# Patient Record
Sex: Male | Born: 1970 | Race: Black or African American | Hispanic: No | Marital: Married | Smoking: Former smoker
Health system: Southern US, Community
[De-identification: ages and names within clinical notes are randomized; demographics above are authoritative.]

## PROBLEM LIST (undated history)

## (undated) DIAGNOSIS — N200 Calculus of kidney: Secondary | ICD-10-CM

## (undated) DIAGNOSIS — N289 Disorder of kidney and ureter, unspecified: Secondary | ICD-10-CM

## (undated) HISTORY — PX: ABDOMINAL SURGERY: SHX537

---

## 1997-07-16 ENCOUNTER — Emergency Department (HOSPITAL_COMMUNITY): Admission: EM | Admit: 1997-07-16 | Discharge: 1997-07-16 | Payer: Self-pay | Admitting: Emergency Medicine

## 2006-03-08 ENCOUNTER — Emergency Department (HOSPITAL_COMMUNITY): Admission: EM | Admit: 2006-03-08 | Discharge: 2006-03-08 | Payer: Self-pay | Admitting: Emergency Medicine

## 2008-05-27 ENCOUNTER — Emergency Department (HOSPITAL_COMMUNITY): Admission: EM | Admit: 2008-05-27 | Discharge: 2008-05-27 | Payer: Self-pay | Admitting: Emergency Medicine

## 2010-07-08 ENCOUNTER — Emergency Department (HOSPITAL_COMMUNITY)
Admission: EM | Admit: 2010-07-08 | Discharge: 2010-07-08 | Disposition: A | Discharge status: Home / Self Care | Payer: Self-pay | Attending: Emergency Medicine | Admitting: Emergency Medicine

## 2010-07-08 ENCOUNTER — Emergency Department (HOSPITAL_COMMUNITY): Payer: Self-pay

## 2010-07-08 DIAGNOSIS — H6590 Unspecified nonsuppurative otitis media, unspecified ear: Secondary | ICD-10-CM | POA: Insufficient documentation

## 2010-07-08 DIAGNOSIS — F1410 Cocaine abuse, uncomplicated: Secondary | ICD-10-CM | POA: Insufficient documentation

## 2010-07-08 DIAGNOSIS — R110 Nausea: Secondary | ICD-10-CM | POA: Insufficient documentation

## 2010-07-08 DIAGNOSIS — H81399 Other peripheral vertigo, unspecified ear: Secondary | ICD-10-CM | POA: Insufficient documentation

## 2010-07-08 DIAGNOSIS — R279 Unspecified lack of coordination: Secondary | ICD-10-CM | POA: Insufficient documentation

## 2010-07-08 LAB — POCT I-STAT, CHEM 8
Calcium, Ion: 1.21 mmol/L (ref 1.12–1.32)
Chloride: 102 mEq/L (ref 96–112)
HCT: 46 % (ref 39.0–52.0)
TCO2: 29 mmol/L (ref 0–100)

## 2010-07-08 LAB — CK TOTAL AND CKMB (NOT AT ARMC): Total CK: 151 U/L (ref 7–232)

## 2011-03-21 ENCOUNTER — Emergency Department (HOSPITAL_COMMUNITY): Payer: Self-pay

## 2011-03-21 ENCOUNTER — Emergency Department (HOSPITAL_COMMUNITY)
Admission: EM | Admit: 2011-03-21 | Discharge: 2011-03-21 | Disposition: A | Discharge status: Home / Self Care | Payer: Self-pay | Attending: Emergency Medicine | Admitting: Emergency Medicine

## 2011-03-21 ENCOUNTER — Encounter (HOSPITAL_COMMUNITY): Payer: Self-pay | Admitting: Emergency Medicine

## 2011-03-21 DIAGNOSIS — R109 Unspecified abdominal pain: Secondary | ICD-10-CM | POA: Insufficient documentation

## 2011-03-21 DIAGNOSIS — N5089 Other specified disorders of the male genital organs: Secondary | ICD-10-CM | POA: Insufficient documentation

## 2011-03-21 DIAGNOSIS — N453 Epididymo-orchitis: Secondary | ICD-10-CM | POA: Insufficient documentation

## 2011-03-21 DIAGNOSIS — F17200 Nicotine dependence, unspecified, uncomplicated: Secondary | ICD-10-CM | POA: Insufficient documentation

## 2011-03-21 DIAGNOSIS — N509 Disorder of male genital organs, unspecified: Secondary | ICD-10-CM | POA: Insufficient documentation

## 2011-03-21 DIAGNOSIS — R10819 Abdominal tenderness, unspecified site: Secondary | ICD-10-CM | POA: Insufficient documentation

## 2011-03-21 DIAGNOSIS — N451 Epididymitis: Secondary | ICD-10-CM

## 2011-03-21 HISTORY — DX: Calculus of kidney: N20.0

## 2011-03-21 HISTORY — DX: Disorder of kidney and ureter, unspecified: N28.9

## 2011-03-21 LAB — URINE MICROSCOPIC-ADD ON

## 2011-03-21 LAB — URINALYSIS, ROUTINE W REFLEX MICROSCOPIC
Bilirubin Urine: NEGATIVE
Glucose, UA: NEGATIVE mg/dL
Ketones, ur: NEGATIVE mg/dL
pH: 6 (ref 5.0–8.0)

## 2011-03-21 MED ORDER — CEFTRIAXONE SODIUM 250 MG IJ SOLR
250.0000 mg | Freq: Once | INTRAMUSCULAR | Status: AC
  Administered 2011-03-21: 250 mg via INTRAMUSCULAR
  Filled 2011-03-21: qty 250

## 2011-03-21 MED ORDER — DOXYCYCLINE HYCLATE 50 MG PO CAPS: 100.0000 mg | ORAL_CAPSULE | Freq: Two times a day (BID) | ORAL | Status: AC

## 2011-03-21 MED ORDER — LIDOCAINE HCL (PF) 1 % IJ SOLN
INTRAMUSCULAR | Status: AC
  Administered 2011-03-21: 2 mL via INTRAMUSCULAR
  Filled 2011-03-21: qty 5

## 2011-03-21 MED ORDER — OXYCODONE-ACETAMINOPHEN 5-325 MG PO TABS
1.0000 | ORAL_TABLET | Freq: Once | ORAL | Status: AC
  Administered 2011-03-21: 1 via ORAL
  Filled 2011-03-21: qty 1

## 2011-03-21 MED ORDER — PERCOCET 5-325 MG PO TABS: 1.0000 | ORAL_TABLET | Freq: Four times a day (QID) | ORAL | Status: AC | PRN

## 2011-03-21 MED ORDER — IBUPROFEN 800 MG PO TABS: 800.0000 mg | ORAL_TABLET | Freq: Three times a day (TID) | ORAL | Status: AC | PRN

## 2011-03-21 NOTE — ED Provider Notes (Signed)
Medical screening examination/treatment/procedure(s) were performed by non-physician practitioner and as supervising physician I was immediately available for consultation/collaboration.   Kirsi Hugh, MD 03/21/11 1600 

## 2011-03-21 NOTE — ED Provider Notes (Signed)
History     CSN: [id number]  Arrival date & time 03/21/11  4098   First MD Initiated Contact with Patient 03/21/11 1004      Chief Complaint  Patient presents with  . Testicle Pain    (Consider location/radiation/quality/duration/timing/severity/associated sxs/prior treatment) HPI Comments: The patient presents today with a 4 day history of left testicle pain. He reports the pain as gradual in onset that he first noticed when he was sitting down. He describes the pain as achy and sharp which radiates to his left lower abdomen and left flank. The pain has become progressively worse and rated 10/10 currently. He reports the left testicle as tender to the touch, and he is unsure of any lumps or masses. He reports a bout of diarrhea about 2 days ago which may or may not be related to the pain, per patient. He denies fever/chills, NV, dysuria, hematuria, and penile discharge.  Patient is a 41 y.o. male presenting with testicular pain. The history is provided by the patient.  Testicle Pain Pertinent negatives include no fever or vomiting.    Past Medical History  Diagnosis Date  . Renal disorder   . Kidney stone     Past Surgical History  Procedure Date  . Appendectomy     No family history on file.  History  Substance Use Topics  . Smoking status: Current Everyday Smoker  . Smokeless tobacco: Not on file  . Alcohol Use: Yes      Review of Systems  Constitutional: Negative for fever.  Gastrointestinal: Negative for vomiting.  Genitourinary: Positive for testicular pain. Negative for hematuria, discharge, penile swelling, scrotal swelling and penile pain.  All other systems reviewed and are negative.    Allergies  Review of patient's allergies indicates no known allergies.  Home Medications  No current outpatient prescriptions on file.  BP 135/89  Pulse 92  Temp(Src) 98.1 F (36.7 C) (Oral)  Resp 16  SpO2 95%  Physical Exam  Nursing note and vitals  reviewed. Constitutional: He is oriented to person, place, and time. He appears well-developed and well-nourished.  HENT:  Head: Normocephalic and atraumatic.  Neck: Neck supple.  Cardiovascular: Normal rate, regular rhythm and normal heart sounds.   Pulmonary/Chest: Breath sounds normal. No respiratory distress. He has no wheezes. He has no rales. He exhibits no tenderness.  Abdominal: Soft. Bowel sounds are normal. He exhibits no distension and no mass. There is tenderness. There is no rebound and no guarding.    Genitourinary: Right testis shows no mass, no swelling and no tenderness. Right testis is descended. Left testis shows swelling and tenderness. Left testis shows no mass. Left testis is descended. Circumcised. No discharge found.       Left inguinal lymphadenopathy  Neurological: He is alert and oriented to person, place, and time.  Psychiatric: He has a normal mood and affect. His behavior is normal. Judgment and thought content normal.    ED Course  Procedures (including critical care time)  Labs Reviewed  URINALYSIS, ROUTINE W REFLEX MICROSCOPIC - Abnormal; Notable for the following:    APPearance HAZY (*)    Hgb urine dipstick SMALL (*)    All other components within normal limits  URINE MICROSCOPIC-ADD ON   US Scrotum  03/21/2011  *RADIOLOGY REPORT*  Clinical Data:  Left testicular pain.  SCROTAL ULTRASOUND DOPPLER ULTRASOUND OF THE TESTICLES  Technique: Complete ultrasound examination of the testicles, epididymis, and other scrotal structures was performed.  Color and spectral Doppler ultrasound were  also utilized to evaluate blood flow to the testicles.  Comparison:  None.  Findings:  Right testis:  Measures 4.4 x 2.4 x 3.1 cm and demonstrates normal, homogeneous echotexture.  Left testis:  Measures 4.3 x 2.4 x 3.0 cm and demonstrates normal, homogeneous echotexture.  Right epididymis:  Normal in size and appearance.  Left epididymis:  Demonstrates increased color Doppler  signal compared to the right epididymis.  Hydocele:  Bilateral, larger on the right.  There appears to be some debris within the right hydrocele.  Varicocele:  Absent.  Pulsed Doppler interrogation of both testes demonstrates normal arterial and venous waveforms.  IMPRESSION: 1.  Findings compatible with epididymitis on the left. 2.  Right greater than left hydroceles.  Original Report Authenticated By: Bernadene Bell. Maricela Curet, M.D.   [place] Art/ven Flow Abd Pelv Doppler  03/21/2011  *RADIOLOGY REPORT*  Clinical Data:  Left testicular pain.  SCROTAL ULTRASOUND DOPPLER ULTRASOUND OF THE TESTICLES  Technique: Complete ultrasound examination of the testicles, epididymis, and other scrotal structures was performed.  Color and spectral Doppler ultrasound were also utilized to evaluate blood flow to the testicles.  Comparison:  None.  Findings:  Right testis:  Measures 4.4 x 2.4 x 3.1 cm and demonstrates normal, homogeneous echotexture.  Left testis:  Measures 4.3 x 2.4 x 3.0 cm and demonstrates normal, homogeneous echotexture.  Right epididymis:  Normal in size and appearance.  Left epididymis:  Demonstrates increased color Doppler signal compared to the right epididymis.  Hydocele:  Bilateral, larger on the right.  There appears to be some debris within the right hydrocele.  Varicocele:  Absent.  Pulsed Doppler interrogation of both testes demonstrates normal arterial and venous waveforms.  IMPRESSION: 1.  Findings compatible with epididymitis on the left. 2.  Right greater than left hydroceles.  Original Report Authenticated By: Bernadene Bell. D'ALESSIO, M.D.     1. Epididymitis, left       MDM  Nontoxic afebrile patient with tenderness of left testicle found to have epididymitis by ultrasound, which is consistent with his clinical presentation.  Patient discharged home with pain medication, antibiotics, and urology followup.  Patient verbalizes understanding and agrees with plan.         Rise Patience,  PA 03/21/11 1433  Rise Patience, [place] 03/21/11 1434

## 2011-03-21 NOTE — ED Notes (Signed)
Pt received to [facility] with c/o LLQ pain, lt testicular pain onset 2-3 days ago that is not getting better. Pt denies any blood in the urine nor fever. Pt is NAD
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
## 2011-03-21 NOTE — ED Notes (Signed)
Left testicle pain x 4 days hurts to touch

## 2011-07-09 ENCOUNTER — Emergency Department (HOSPITAL_COMMUNITY)
Admission: EM | Admit: 2011-07-09 | Discharge: 2011-07-09 | Disposition: A | Discharge status: Home / Self Care | Payer: Self-pay | Attending: Emergency Medicine | Admitting: Emergency Medicine

## 2011-07-09 ENCOUNTER — Encounter (HOSPITAL_COMMUNITY): Payer: Self-pay | Admitting: Emergency Medicine

## 2011-07-09 DIAGNOSIS — T25022A Burn of unspecified degree of left foot, initial encounter: Secondary | ICD-10-CM

## 2011-07-09 DIAGNOSIS — X19XXXA Contact with other heat and hot substances, initial encounter: Secondary | ICD-10-CM | POA: Insufficient documentation

## 2011-07-09 DIAGNOSIS — Z23 Encounter for immunization: Secondary | ICD-10-CM | POA: Insufficient documentation

## 2011-07-09 DIAGNOSIS — T25239A Burn of second degree of unspecified toe(s) (nail), initial encounter: Secondary | ICD-10-CM | POA: Insufficient documentation

## 2011-07-09 HISTORY — DX: Calculus of kidney: N20.0

## 2011-07-09 MED ORDER — HYDROCODONE-ACETAMINOPHEN 5-500 MG PO TABS: 1.0000 | ORAL_TABLET | Freq: Four times a day (QID) | ORAL | Status: AC | PRN

## 2011-07-09 MED ORDER — IBUPROFEN 200 MG PO TABS
600.0000 mg | ORAL_TABLET | Freq: Once | ORAL | Status: AC
  Administered 2011-07-09: 600 mg via ORAL
  Filled 2011-07-09: qty 3

## 2011-07-09 MED ORDER — IBUPROFEN 600 MG PO TABS: 600.0000 mg | ORAL_TABLET | Freq: Four times a day (QID) | ORAL | Status: AC | PRN

## 2011-07-09 MED ORDER — BACITRACIN 500 UNIT/GM EX OINT
1.0000 "application " | TOPICAL_OINTMENT | Freq: Two times a day (BID) | CUTANEOUS | Status: DC
  Administered 2011-07-09: 1 via TOPICAL
  Filled 2011-07-09 (×3): qty 0.9

## 2011-07-09 MED ORDER — TETANUS-DIPHTH-ACELL PERTUSSIS 5-2.5-18.5 LF-MCG/0.5 IM SUSP
0.5000 mL | Freq: Once | INTRAMUSCULAR | Status: AC
  Administered 2011-07-09: 0.5 mL via INTRAMUSCULAR
  Filled 2011-07-09: qty 0.5

## 2011-07-09 MED ORDER — HYDROCODONE-ACETAMINOPHEN 5-325 MG PO TABS
1.0000 | ORAL_TABLET | Freq: Once | ORAL | Status: DC
  Filled 2011-07-09: qty 1
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
## 2011-07-09 NOTE — Discharge Instructions (Signed)
Keep burn area very clean/dry.  Wash area thoroughly 2x/day with warm soap and water.  You may apply a thin coat of bacitracin or neosporin to areas for next few days after thoroughly cleaning wound.  Take motrin as need for pain.  Follow up with primary care doctor/urgent care in 1 week for recheck. Return to ER if worse, severe pain, spreading redness, fevers, other concern.      Burn Care Your skin is a natural barrier to infection. It is the largest organ of your body. Burns damage this natural protection. To help prevent infection, it is very important to follow your caregiver's instructions in the care of your burn. Burns are classified as:  First degree. There is only redness of the skin (erythema). No scarring is expected.   Second degree. There is blistering of the skin. Scarring may occur with deeper burns.   Third degree. All layers of the skin are injured, and scarring is expected.  HOME CARE INSTRUCTIONS   Wash your hands well before changing your bandage.   Change your bandage as often as directed by your caregiver.   Remove the old bandage. If the bandage sticks, you may soak it off with cool, clean water.   Cleanse the burn thoroughly but gently with mild soap and water.   Pat the area dry with a clean, dry cloth.   Apply a thin layer of antibacterial cream to the burn.   Apply a clean bandage as instructed by your caregiver.   Keep the bandage as clean and dry as possible.   Elevate the affected area for the first 24 hours, then as instructed by your caregiver.   Only take over-the-counter or prescription medicines for pain, discomfort, or fever as directed by your caregiver.  SEEK IMMEDIATE MEDICAL CARE IF:   You develop excessive pain.   You develop redness, tenderness, swelling, or red streaks near the burn.   The burned area develops yellowish-white fluid (pus) or a bad smell.   You have a fever.  MAKE SURE YOU:   Understand these instructions.     Will watch your condition.   Will get help right away if you are not doing well or get worse.  Document Released: 01/22/2005 Document Revised: 01/11/2011 Document Reviewed: 06/14/2010 [facility] Patient Information 2012 [place], [place].

## 2011-07-09 NOTE — ED Notes (Signed)
Site clean. Bacitracin applied and wrapped. Pt given boot for comfort.
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
## 2011-09-27 ENCOUNTER — Emergency Department (HOSPITAL_COMMUNITY): Payer: Self-pay

## 2011-09-27 ENCOUNTER — Emergency Department (HOSPITAL_COMMUNITY)
Admission: EM | Admit: 2011-09-27 | Discharge: 2011-09-27 | Disposition: A | Discharge status: Home / Self Care | Payer: Self-pay | Attending: Emergency Medicine | Admitting: Emergency Medicine

## 2011-09-27 ENCOUNTER — Encounter (HOSPITAL_COMMUNITY): Payer: Self-pay

## 2011-09-27 DIAGNOSIS — F17200 Nicotine dependence, unspecified, uncomplicated: Secondary | ICD-10-CM | POA: Insufficient documentation

## 2011-09-27 DIAGNOSIS — R109 Unspecified abdominal pain: Secondary | ICD-10-CM | POA: Insufficient documentation

## 2011-09-27 DIAGNOSIS — N200 Calculus of kidney: Secondary | ICD-10-CM

## 2011-09-27 DIAGNOSIS — N201 Calculus of ureter: Secondary | ICD-10-CM | POA: Insufficient documentation

## 2011-09-27 LAB — CBC WITH DIFFERENTIAL/PLATELET
Eosinophils Absolute: 0.1 10*3/uL (ref 0.0–0.7)
Lymphs Abs: 2.4 10*3/uL (ref 0.7–4.0)
MCH: 30 pg (ref 26.0–34.0)
Neutrophils Relative %: 62 % (ref 43–77)
Platelets: 231 10*3/uL (ref 150–400)
RBC: 4.76 MIL/uL (ref 4.22–5.81)
WBC: 8.3 10*3/uL (ref 4.0–10.5)

## 2011-09-27 LAB — COMPREHENSIVE METABOLIC PANEL
ALT: 20 U/L (ref 0–53)
AST: 17 U/L (ref 0–37)
Albumin: 4.2 g/dL (ref 3.5–5.2)
Alkaline Phosphatase: 63 U/L (ref 39–117)
GFR calc Af Amer: 44 mL/min — ABNORMAL LOW (ref >90)
Glucose, Bld: 95 mg/dL (ref 70–99)
Potassium: 4.3 mEq/L (ref 3.5–5.1)
Sodium: 140 mEq/L (ref 135–145)
Total Protein: 8 g/dL (ref 6.0–8.3)

## 2011-09-27 LAB — URINALYSIS, ROUTINE W REFLEX MICROSCOPIC
Nitrite: NEGATIVE
Specific Gravity, Urine: 1.014 (ref 1.005–1.030)
Urobilinogen, UA: 0.2 mg/dL (ref 0.0–1.0)

## 2011-09-27 LAB — URINE MICROSCOPIC-ADD ON

## 2011-09-27 MED ORDER — OXYCODONE-ACETAMINOPHEN 5-325 MG PO TABS: 1.0000 | ORAL_TABLET | Freq: Four times a day (QID) | ORAL | Status: AC | PRN

## 2011-09-27 MED ORDER — TAMSULOSIN HCL 0.4 MG PO CAPS: 0.4000 mg | ORAL_CAPSULE | Freq: Every day | ORAL | Status: DC

## 2011-09-27 MED ORDER — KETOROLAC TROMETHAMINE 60 MG/2ML IM SOLN
60.0000 mg | Freq: Once | INTRAMUSCULAR | Status: AC
  Administered 2011-09-27: 60 mg via INTRAMUSCULAR
  Filled 2011-09-27: qty 2
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
## 2011-09-27 NOTE — ED Notes (Signed)
Pt reports (R) flank pain radiating around to RLQ region and N/V x4 days. Pt denies problems w/urinating, pt reports hx of kidney stones

## 2015-04-11 ENCOUNTER — Emergency Department (HOSPITAL_COMMUNITY)
Admission: EM | Admit: 2015-04-11 | Discharge: 2015-04-11 | Disposition: A | Discharge status: Home / Self Care | Payer: Self-pay | Attending: Emergency Medicine | Admitting: Emergency Medicine

## 2015-04-11 ENCOUNTER — Encounter (HOSPITAL_COMMUNITY): Payer: Self-pay | Admitting: Emergency Medicine

## 2015-04-11 ENCOUNTER — Emergency Department (HOSPITAL_COMMUNITY): Payer: Self-pay

## 2015-04-11 DIAGNOSIS — W3400XA Accidental discharge from unspecified firearms or gun, initial encounter: Secondary | ICD-10-CM | POA: Insufficient documentation

## 2015-04-11 DIAGNOSIS — Y9389 Activity, other specified: Secondary | ICD-10-CM | POA: Insufficient documentation

## 2015-04-11 DIAGNOSIS — S71142A Puncture wound with foreign body, left thigh, initial encounter: Secondary | ICD-10-CM | POA: Insufficient documentation

## 2015-04-11 DIAGNOSIS — Y998 Other external cause status: Secondary | ICD-10-CM | POA: Insufficient documentation

## 2015-04-11 DIAGNOSIS — Y9289 Other specified places as the place of occurrence of the external cause: Secondary | ICD-10-CM | POA: Insufficient documentation

## 2015-04-11 DIAGNOSIS — S71132A Puncture wound without foreign body, left thigh, initial encounter: Secondary | ICD-10-CM

## 2015-04-11 DIAGNOSIS — T1490XA Injury, unspecified, initial encounter: Secondary | ICD-10-CM

## 2015-04-11 LAB — COMPREHENSIVE METABOLIC PANEL
ALBUMIN: 3.9 g/dL (ref 3.5–5.0)
ALT: 16 U/L — ABNORMAL LOW (ref 17–63)
ANION GAP: 20 — AB (ref 5–15)
AST: 29 U/L (ref 15–41)
Alkaline Phosphatase: 56 U/L (ref 38–126)
BUN: 15 mg/dL (ref 6–20)
CO2: 15 mmol/L — AB (ref 22–32)
Calcium: 9.2 mg/dL (ref 8.9–10.3)
Chloride: 106 mmol/L (ref 101–111)
Creatinine, Ser: 1.61 mg/dL — ABNORMAL HIGH (ref 0.61–1.24)
GFR calc non Af Amer: 51 mL/min — ABNORMAL LOW (ref >60)
GFR, EST AFRICAN AMERICAN: 59 mL/min — AB (ref >60)
GLUCOSE: 236 mg/dL — AB (ref 65–99)
POTASSIUM: 4 mmol/L (ref 3.5–5.1)
SODIUM: 141 mmol/L (ref 135–145)
TOTAL PROTEIN: 7.1 g/dL (ref 6.5–8.1)
Total Bilirubin: 0.6 mg/dL (ref 0.3–1.2)

## 2015-04-11 LAB — PREPARE FRESH FROZEN PLASMA
UNIT DIVISION: 0
Unit division: 0

## 2015-04-11 LAB — CBC
HEMATOCRIT: 39.5 % (ref 39.0–52.0)
HEMOGLOBIN: 13.1 g/dL (ref 13.0–17.0)
MCH: 29.7 pg (ref 26.0–34.0)
MCHC: 33.2 g/dL (ref 30.0–36.0)
MCV: 89.6 fL (ref 78.0–100.0)
Platelets: 243 10*3/uL (ref 150–400)
RBC: 4.41 MIL/uL (ref 4.22–5.81)
RDW: 14.2 % (ref 11.5–15.5)
WBC: 8.4 10*3/uL (ref 4.0–10.5)

## 2015-04-11 LAB — PROTIME-INR
INR: 1.15 (ref 0.00–1.49)
PROTHROMBIN TIME: 14.9 s (ref 11.6–15.2)

## 2015-04-11 LAB — ETHANOL: Alcohol, Ethyl (B): <5 mg/dL (ref <5)

## 2015-04-11 LAB — CDS SEROLOGY

## 2015-04-11 LAB — ABO/RH: ABO/RH(D): A NEG

## 2015-04-11 MED ORDER — FENTANYL CITRATE (PF) 100 MCG/2ML IJ SOLN
INTRAMUSCULAR | Status: AC
  Filled 2015-04-11: qty 2

## 2015-04-11 MED ORDER — HYDROMORPHONE HCL 1 MG/ML IJ SOLN
INTRAMUSCULAR | Status: DC
Start: 2015-04-11 — End: 2015-04-11
  Filled 2015-04-11: qty 1

## 2015-04-11 MED ORDER — FENTANYL CITRATE (PF) 100 MCG/2ML IJ SOLN: 75.0000 ug | Freq: Once | INTRAMUSCULAR | Status: DC

## 2015-04-11 MED ORDER — HYDROCODONE-ACETAMINOPHEN 5-325 MG PO TABS: 1.0000 | ORAL_TABLET | Freq: Three times a day (TID) | ORAL | Status: AC | PRN

## 2015-04-11 MED ORDER — ACETAMINOPHEN 500 MG PO TABS: 1000.0000 mg | ORAL_TABLET | Freq: Three times a day (TID) | ORAL | Status: AC

## 2015-04-11 MED ORDER — HYDROMORPHONE HCL 1 MG/ML IJ SOLN
1.0000 mg | Freq: Once | INTRAMUSCULAR | Status: AC
  Administered 2015-04-11: 1 mg via INTRAVENOUS

## 2015-04-11 MED ORDER — IOHEXOL 350 MG/ML SOLN
100.0000 mL | Freq: Once | INTRAVENOUS | Status: AC | PRN
  Administered 2015-04-11: 100 mL via INTRAVENOUS

## 2015-04-11 MED ORDER — TETANUS-DIPHTH-ACELL PERTUSSIS 5-2.5-18.5 LF-MCG/0.5 IM SUSP: 0.5000 mL | Freq: Once | INTRAMUSCULAR | Status: DC
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
## 2015-04-11 NOTE — Progress Notes (Signed)
Orthopedic Tech Progress Note Patient Details:  Trevor Poserdward L Benjamin 12/26/1970 [id number]  Patient ID: Trevor Benjamin, male   DOB: 12/26/1970, 45 y.o.   MRN: [id number]   Trevor Benjamin 04/11/2015, 3:00 PMLevel one  Trauma.

## 2015-04-11 NOTE — Progress Notes (Signed)
Orthopedic Tech Progress Note Patient Details:  Trevor Benjamin [birth year] [id number]  Ortho Devices Type of Ortho Device: Crutches Ortho Device/Splint Interventions: Application   Saul FordyceJennifer C Marcel Benjamin 04/11/2015, 5:28 PM
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
## 2015-04-12 ENCOUNTER — Encounter (HOSPITAL_COMMUNITY): Payer: Self-pay

## 2015-04-12 LAB — TYPE AND SCREEN
ABO/RH(D): A NEG
ANTIBODY SCREEN: NEGATIVE
UNIT DIVISION: 0
Unit division: 0

## 2015-04-19 ENCOUNTER — Telehealth (HOSPITAL_COMMUNITY): Payer: Self-pay

## 2015-04-20 NOTE — Telephone Encounter (Signed)
Message left  No answer
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
## 2015-04-22 ENCOUNTER — Telehealth (HOSPITAL_COMMUNITY): Payer: Self-pay

## 2015-04-22 NOTE — Telephone Encounter (Signed)
Talked about ibuprofen/tylenol, ice, and elevation.  Discussed cleaning wound with soap/water in the shower and applying antibiotic ointment and band-aids to the site.    Made him an appt for a wound check on 04/27/15 at 3:00pm

## 2015-04-27 ENCOUNTER — Telehealth (HOSPITAL_COMMUNITY): Payer: Self-pay

## 2016-01-17 ENCOUNTER — Emergency Department (HOSPITAL_COMMUNITY)
Admission: EM | Admit: 2016-01-17 | Discharge: 2016-01-17 | Disposition: A | Discharge status: Home / Self Care | Payer: Self-pay | Attending: Emergency Medicine | Admitting: Emergency Medicine

## 2016-01-17 ENCOUNTER — Encounter (HOSPITAL_COMMUNITY): Payer: Self-pay | Admitting: Emergency Medicine

## 2016-01-17 DIAGNOSIS — T148XXA Other injury of unspecified body region, initial encounter: Secondary | ICD-10-CM

## 2016-01-17 DIAGNOSIS — X500XXA Overexertion from strenuous movement or load, initial encounter: Secondary | ICD-10-CM | POA: Insufficient documentation

## 2016-01-17 DIAGNOSIS — Y990 Civilian activity done for income or pay: Secondary | ICD-10-CM | POA: Insufficient documentation

## 2016-01-17 DIAGNOSIS — Y929 Unspecified place or not applicable: Secondary | ICD-10-CM | POA: Insufficient documentation

## 2016-01-17 DIAGNOSIS — Y939 Activity, unspecified: Secondary | ICD-10-CM | POA: Insufficient documentation

## 2016-01-17 DIAGNOSIS — M549 Dorsalgia, unspecified: Secondary | ICD-10-CM

## 2016-01-17 DIAGNOSIS — F17290 Nicotine dependence, other tobacco product, uncomplicated: Secondary | ICD-10-CM | POA: Insufficient documentation

## 2016-01-17 DIAGNOSIS — S29012A Strain of muscle and tendon of back wall of thorax, initial encounter: Secondary | ICD-10-CM | POA: Insufficient documentation

## 2016-01-17 MED ORDER — METHOCARBAMOL 500 MG PO TABS: 500.0000 mg | ORAL_TABLET | Freq: Four times a day (QID) | ORAL | 0 refills | Status: DC | PRN

## 2016-01-17 MED ORDER — IBUPROFEN 800 MG PO TABS: 800.0000 mg | ORAL_TABLET | Freq: Three times a day (TID) | ORAL | 0 refills | Status: DC | PRN

## 2016-01-17 MED ORDER — LIDOCAINE 5 % EX PTCH: 1.0000 | MEDICATED_PATCH | CUTANEOUS | 0 refills | Status: DC

## 2016-01-17 MED ORDER — METHOCARBAMOL 500 MG PO TABS
1000.0000 mg | ORAL_TABLET | Freq: Once | ORAL | Status: AC
  Administered 2016-01-17: 1000 mg via ORAL
  Filled 2016-01-17: qty 2

## 2016-01-17 NOTE — ED Notes (Signed)
Patient was alert, oriented and stable upon discharge. RN went over AVS and patient had no further questions.  
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
## 2016-01-17 NOTE — ED Triage Notes (Signed)
Patient c/o pinched nerve in upper back area for several days.

## 2016-01-17 NOTE — Discharge Instructions (Signed)
Read the information below.  Use the prescribed medication as directed.  Please discuss all new medications with your pharmacist.  You may return to the Emergency Department at any time for worsening condition or any new symptoms that concern you.    If you develop fevers, loss of control of bowel or bladder, weakness or numbness in your arms or legs, or are unable to walk, return to the ER for a recheck.  °

## 2016-06-15 ENCOUNTER — Emergency Department (HOSPITAL_BASED_OUTPATIENT_CLINIC_OR_DEPARTMENT_OTHER)
Admission: EM | Admit: 2016-06-15 | Discharge: 2016-06-15 | Disposition: A | Discharge status: Home / Self Care | Payer: Self-pay | Attending: Emergency Medicine | Admitting: Emergency Medicine

## 2016-06-15 ENCOUNTER — Encounter (HOSPITAL_BASED_OUTPATIENT_CLINIC_OR_DEPARTMENT_OTHER): Payer: Self-pay | Admitting: *Deleted

## 2016-06-15 ENCOUNTER — Emergency Department (HOSPITAL_BASED_OUTPATIENT_CLINIC_OR_DEPARTMENT_OTHER): Payer: Self-pay

## 2016-06-15 DIAGNOSIS — M5412 Radiculopathy, cervical region: Secondary | ICD-10-CM | POA: Insufficient documentation

## 2016-06-15 DIAGNOSIS — R0789 Other chest pain: Secondary | ICD-10-CM | POA: Insufficient documentation

## 2016-06-15 DIAGNOSIS — Z87891 Personal history of nicotine dependence: Secondary | ICD-10-CM | POA: Insufficient documentation

## 2016-06-15 DIAGNOSIS — N289 Disorder of kidney and ureter, unspecified: Secondary | ICD-10-CM | POA: Insufficient documentation

## 2016-06-15 LAB — CBC
HCT: 39.1 % (ref 39.0–52.0)
Hemoglobin: 13.5 g/dL (ref 13.0–17.0)
MCH: 30.6 pg (ref 26.0–34.0)
MCHC: 34.5 g/dL (ref 30.0–36.0)
MCV: 88.7 fL (ref 78.0–100.0)
PLATELETS: 259 10*3/uL (ref 150–400)
RBC: 4.41 MIL/uL (ref 4.22–5.81)
RDW: 14 % (ref 11.5–15.5)
WBC: 6 10*3/uL (ref 4.0–10.5)

## 2016-06-15 LAB — CBG MONITORING, ED: GLUCOSE-CAPILLARY: 116 mg/dL — AB (ref 65–99)

## 2016-06-15 LAB — BASIC METABOLIC PANEL
Anion gap: 7 (ref 5–15)
BUN: 16 mg/dL (ref 6–20)
CALCIUM: 9 mg/dL (ref 8.9–10.3)
CO2: 27 mmol/L (ref 22–32)
CREATININE: 1.31 mg/dL — AB (ref 0.61–1.24)
Chloride: 107 mmol/L (ref 101–111)
GFR calc Af Amer: >60 mL/min (ref >60)
GFR calc non Af Amer: >60 mL/min (ref >60)
GLUCOSE: 125 mg/dL — AB (ref 65–99)
Potassium: 4.2 mmol/L (ref 3.5–5.1)
Sodium: 141 mmol/L (ref 135–145)

## 2016-06-15 LAB — TROPONIN I: Troponin I: <0.03 ng/mL (ref <0.03)

## 2016-06-15 MED ORDER — ASPIRIN 81 MG PO CHEW
324.0000 mg | CHEWABLE_TABLET | Freq: Once | ORAL | Status: AC
  Administered 2016-06-15: 324 mg via ORAL
  Filled 2016-06-15: qty 4

## 2016-06-15 MED ORDER — DEXAMETHASONE SODIUM PHOSPHATE 10 MG/ML IJ SOLN
10.0000 mg | Freq: Once | INTRAMUSCULAR | Status: AC
  Administered 2016-06-15: 10 mg via INTRAVENOUS
  Filled 2016-06-15: qty 1

## 2016-06-15 MED ORDER — PREDNISONE 10 MG (21) PO TBPK: ORAL_TABLET | ORAL | 0 refills | Status: AC

## 2016-06-15 MED ORDER — HYDROCODONE-ACETAMINOPHEN 5-325 MG PO TABS: 1.0000 | ORAL_TABLET | ORAL | 0 refills | Status: AC | PRN

## 2016-06-15 MED FILL — predniSONE 10 MG TABS: 10 | 12 days supply | Qty: 42 | Fill #0

## 2016-06-15 MED FILL — HYDROCODON-APAP 5-325: 5-325 | 2 days supply | Qty: 10 | Fill #0
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
## 2016-06-15 NOTE — ED Triage Notes (Signed)
Pt report left sided chest pain radiating to left arm, hand and neck x 5 days. Pain is constant, increases with exertion and deep inspiration. Denies sob, dizzyness, nausea or diaphoresis. ekg performed while pt being triaged.
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
## 2018-04-28 IMAGING — CR DG CHEST 2V
2 series · 2 of 2 positions shown · non-contrast
Comparison: CT Abdomen and Pelvis 09/27/2011

CLINICAL DATA: 45-year-old male with chest pain radiating to the
left arm for 4 days.

EXAM:
CHEST  2 VIEW

[w chest pa]
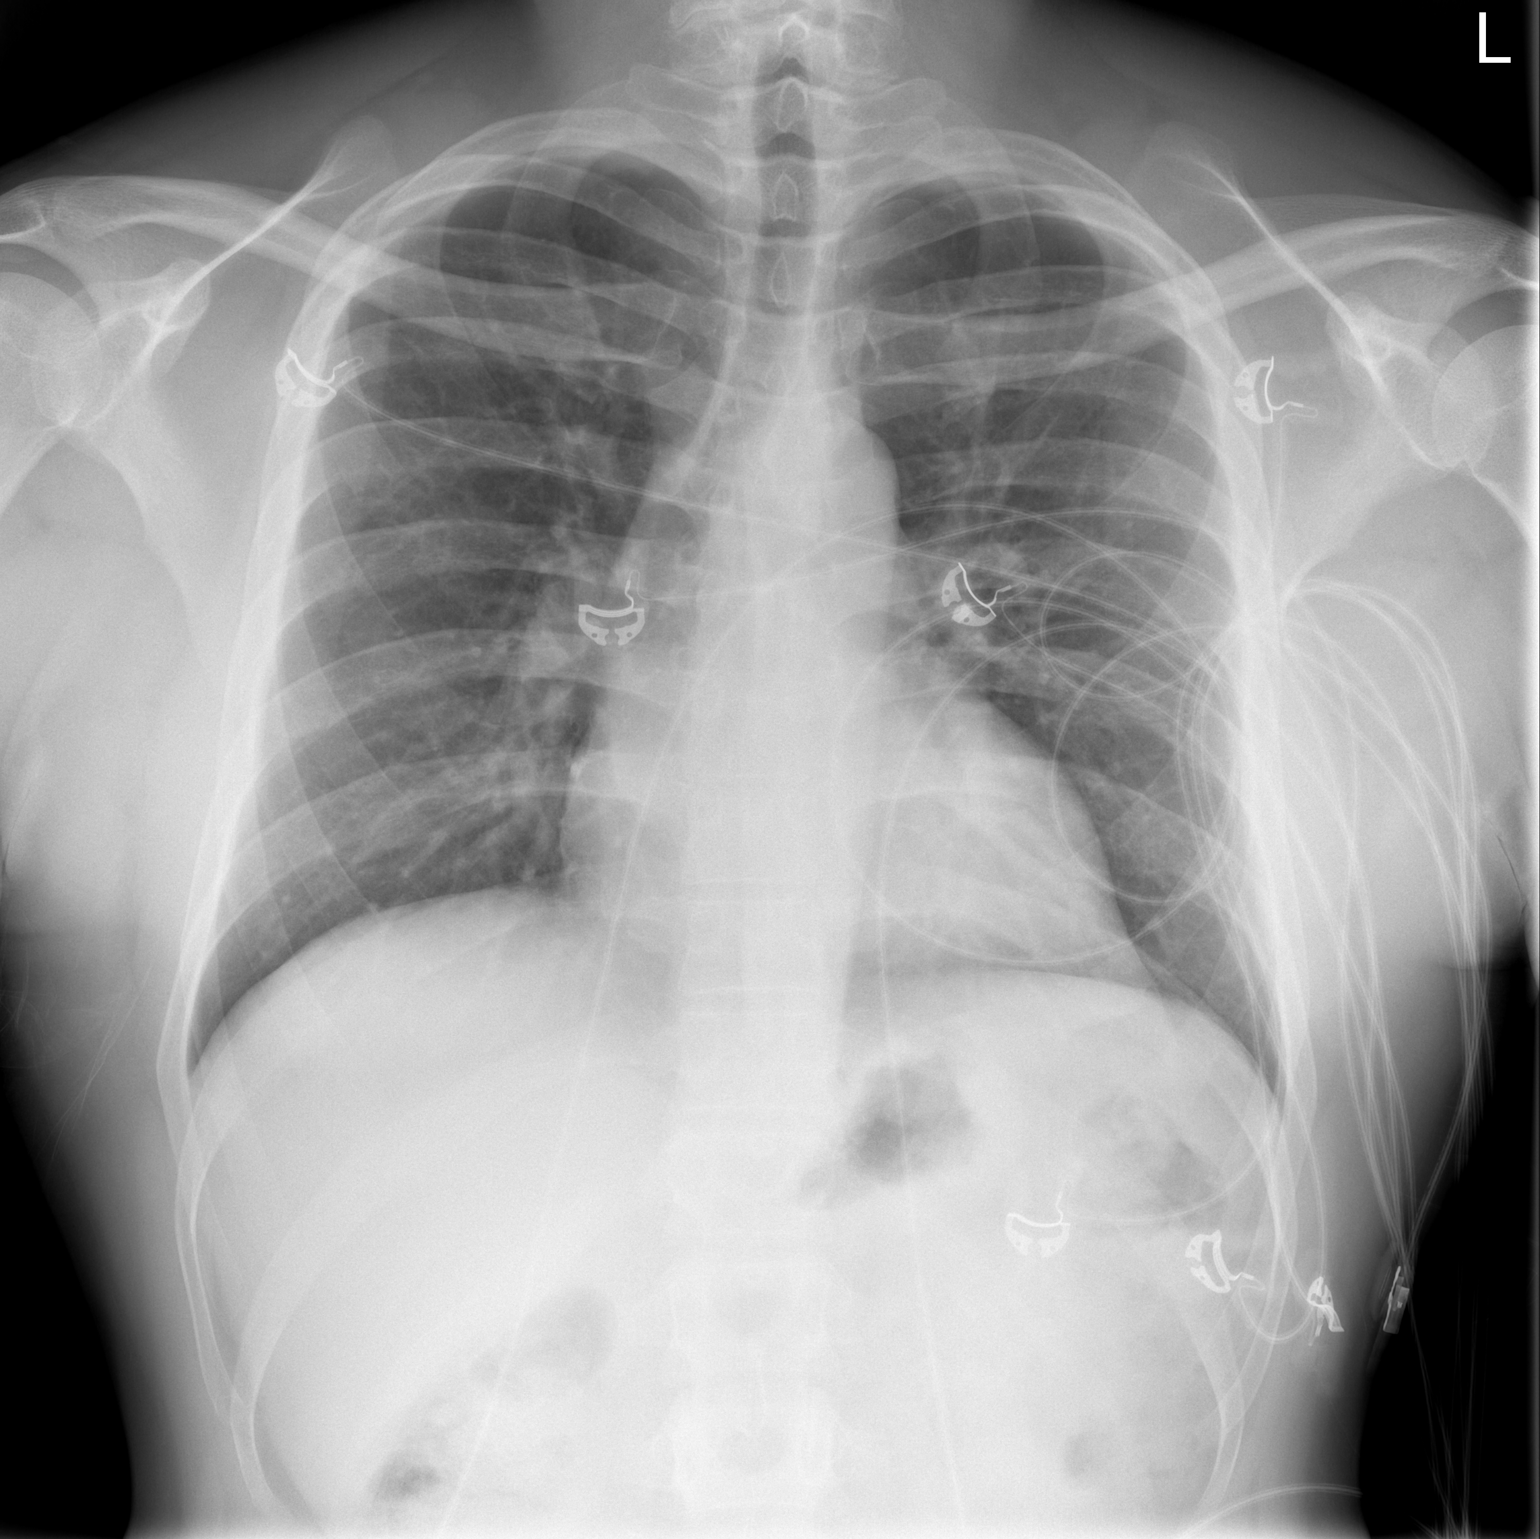

[w chest lat]
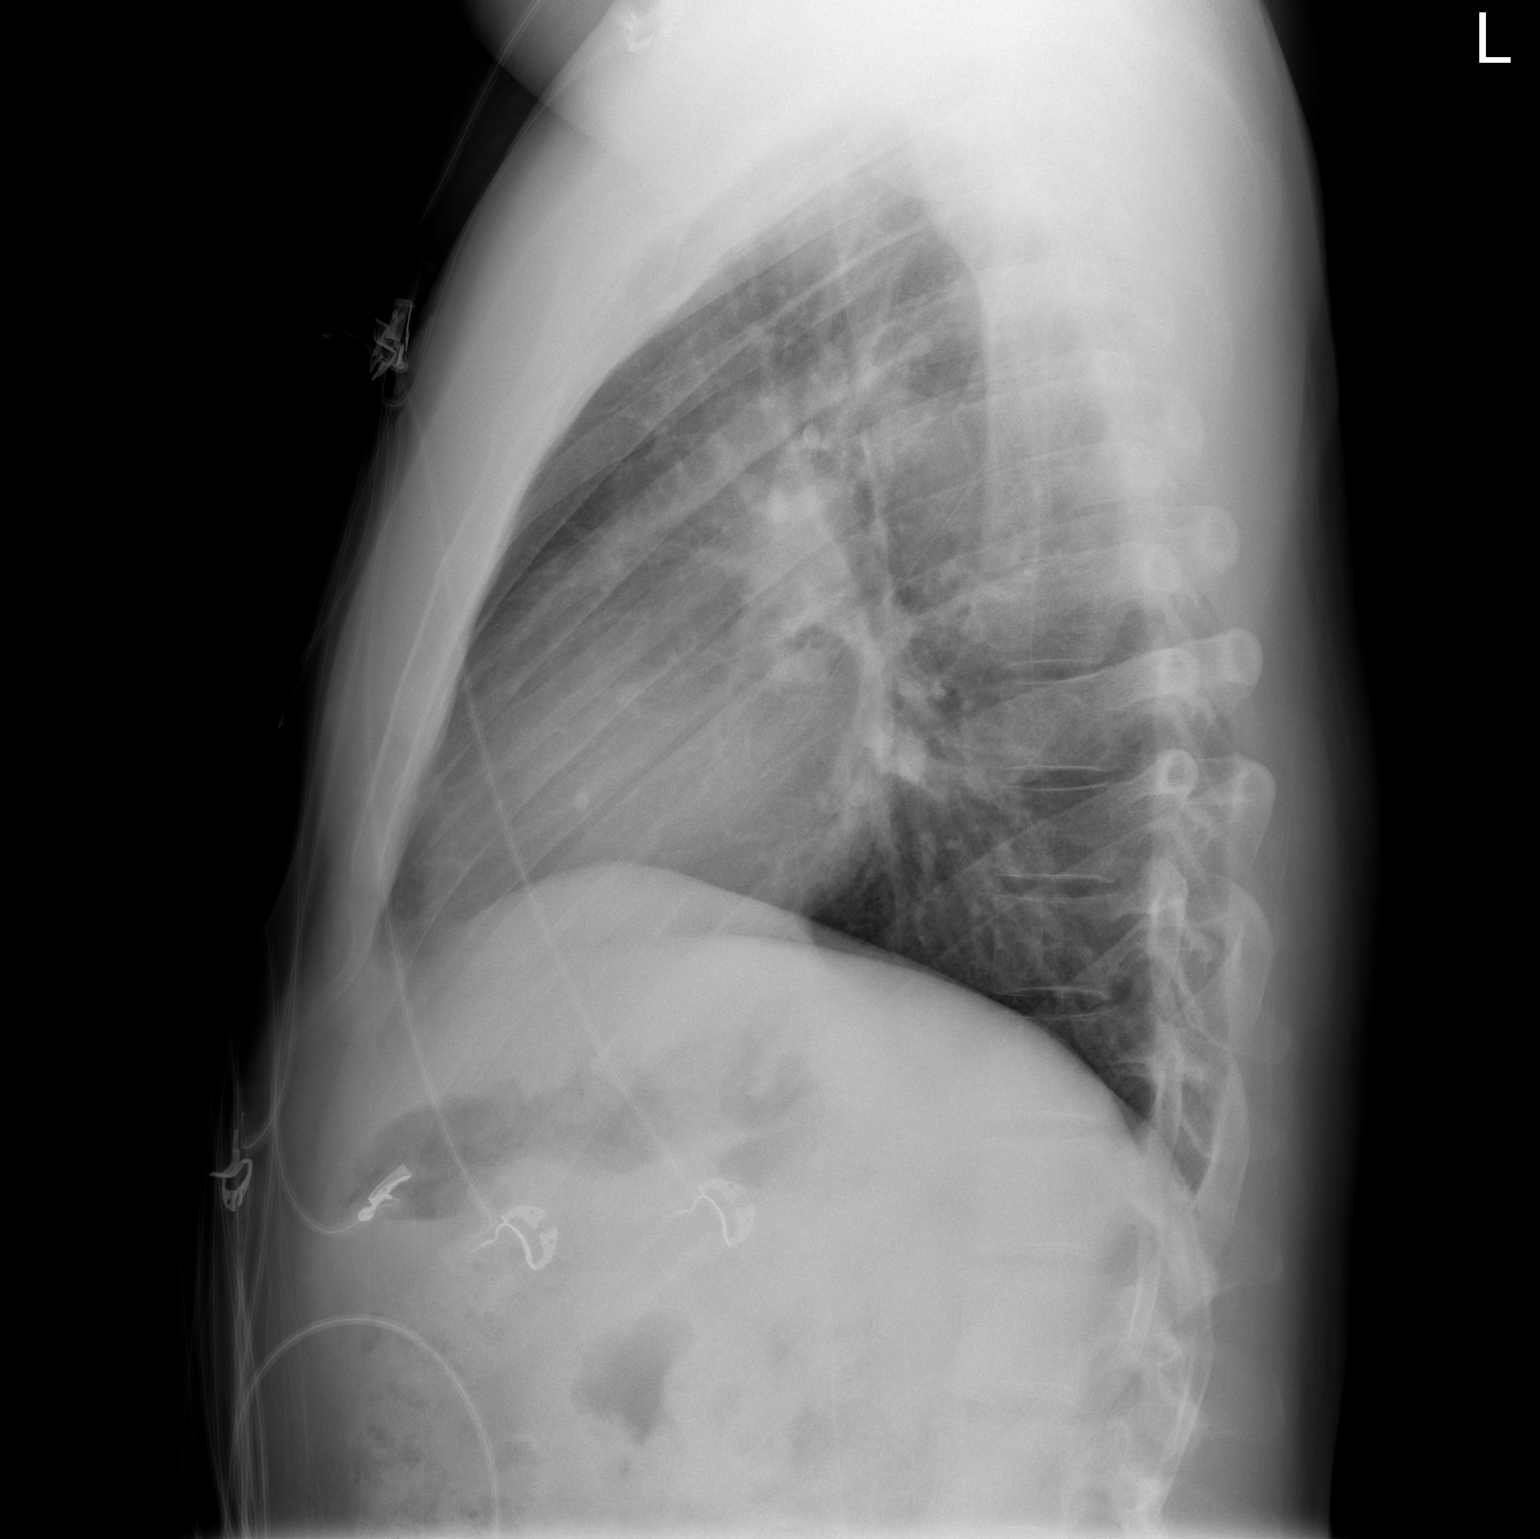

[2 of 2 positions shown; findings below may reference images not displayed]

FINDINGS: Lung volumes are within normal limits. Cardiac size is at the upper
limits of normal. Other mediastinal contours are within normal
limits. Visualized tracheal air column is within normal limits. The
lungs are clear. No pneumothorax or pleural effusion. No acute
osseous abnormality identified. Negative visible bowel gas pattern.
IMPRESSION: Negative.  No acute cardiopulmonary abnormality.
